# Patient Record
Sex: Female | Born: 1990 | Race: White | Hispanic: No | Marital: Single | Smoking: Never smoker
Health system: Southern US, Community
[De-identification: ages and names within clinical notes are randomized; demographics above are authoritative.]

## PROBLEM LIST (undated history)

## (undated) DIAGNOSIS — N200 Calculus of kidney: Secondary | ICD-10-CM

---

## 2014-07-21 ENCOUNTER — Emergency Department (HOSPITAL_BASED_OUTPATIENT_CLINIC_OR_DEPARTMENT_OTHER): Payer: Self-pay

## 2014-07-21 ENCOUNTER — Emergency Department (HOSPITAL_BASED_OUTPATIENT_CLINIC_OR_DEPARTMENT_OTHER)
Admission: EM | Admit: 2014-07-21 | Discharge: 2014-07-21 | Disposition: A | Discharge status: Home / Self Care | Payer: Self-pay | Attending: Emergency Medicine | Admitting: Emergency Medicine

## 2014-07-21 ENCOUNTER — Encounter (HOSPITAL_BASED_OUTPATIENT_CLINIC_OR_DEPARTMENT_OTHER): Payer: Self-pay | Admitting: *Deleted

## 2014-07-21 DIAGNOSIS — R319 Hematuria, unspecified: Secondary | ICD-10-CM | POA: Insufficient documentation

## 2014-07-21 DIAGNOSIS — Z3202 Encounter for pregnancy test, result negative: Secondary | ICD-10-CM | POA: Insufficient documentation

## 2014-07-21 DIAGNOSIS — R1031 Right lower quadrant pain: Secondary | ICD-10-CM

## 2014-07-21 DIAGNOSIS — N201 Calculus of ureter: Secondary | ICD-10-CM | POA: Insufficient documentation

## 2014-07-21 LAB — COMPREHENSIVE METABOLIC PANEL
ALT: 26 U/L (ref 0–35)
ANION GAP: 11 (ref 5–15)
AST: 57 U/L — ABNORMAL HIGH (ref 0–37)
Albumin: 4.7 g/dL (ref 3.5–5.2)
Alkaline Phosphatase: 66 U/L (ref 39–117)
BUN: 13 mg/dL (ref 6–23)
CO2: 22 mmol/L (ref 19–32)
Calcium: 9.5 mg/dL (ref 8.4–10.5)
Chloride: 105 mEq/L (ref 96–112)
Creatinine, Ser: 0.95 mg/dL (ref 0.50–1.10)
GFR calc Af Amer: >90 mL/min (ref >90)
GFR calc non Af Amer: 84 mL/min — ABNORMAL LOW (ref >90)
GLUCOSE: 108 mg/dL — AB (ref 70–99)
Potassium: 3.3 mmol/L — ABNORMAL LOW (ref 3.5–5.1)
SODIUM: 138 mmol/L (ref 135–145)
Total Bilirubin: 0.8 mg/dL (ref 0.3–1.2)
Total Protein: 8.1 g/dL (ref 6.0–8.3)

## 2014-07-21 LAB — CBC WITH DIFFERENTIAL/PLATELET
BASOS ABS: 0 10*3/uL (ref 0.0–0.1)
Basophils Relative: 0 % (ref 0–1)
EOS ABS: 0.1 10*3/uL (ref 0.0–0.7)
Eosinophils Relative: 1 % (ref 0–5)
HCT: 40.9 % (ref 36.0–46.0)
Hemoglobin: 14.3 g/dL (ref 12.0–15.0)
LYMPHS ABS: 3.1 10*3/uL (ref 0.7–4.0)
Lymphocytes Relative: 30 % (ref 12–46)
MCH: 30 pg (ref 26.0–34.0)
MCHC: 35 g/dL (ref 30.0–36.0)
MCV: 85.7 fL (ref 78.0–100.0)
Monocytes Absolute: 1.1 10*3/uL — ABNORMAL HIGH (ref 0.1–1.0)
Monocytes Relative: 10 % (ref 3–12)
Neutro Abs: 6 10*3/uL (ref 1.7–7.7)
Neutrophils Relative %: 59 % (ref 43–77)
PLATELETS: 336 10*3/uL (ref 150–400)
RBC: 4.77 MIL/uL (ref 3.87–5.11)
RDW: 12.1 % (ref 11.5–15.5)
WBC: 10.3 10*3/uL (ref 4.0–10.5)

## 2014-07-21 LAB — URINALYSIS, ROUTINE W REFLEX MICROSCOPIC
Bilirubin Urine: NEGATIVE
Glucose, UA: NEGATIVE mg/dL
Ketones, ur: 15 mg/dL — AB
LEUKOCYTES UA: NEGATIVE
NITRITE: NEGATIVE
Protein, ur: NEGATIVE mg/dL
SPECIFIC GRAVITY, URINE: 1.011 (ref 1.005–1.030)
Urobilinogen, UA: 0.2 mg/dL (ref 0.0–1.0)
pH: 8 (ref 5.0–8.0)

## 2014-07-21 LAB — PREGNANCY, URINE: Preg Test, Ur: NEGATIVE

## 2014-07-21 LAB — URINE MICROSCOPIC-ADD ON

## 2014-07-21 MED ORDER — ONDANSETRON HCL 4 MG/2ML IJ SOLN
4.0000 mg | Freq: Once | INTRAMUSCULAR | Status: AC
Start: 2014-07-21 — End: 2014-07-21
  Administered 2014-07-21: 4 mg via INTRAVENOUS
  Filled 2014-07-21: qty 2

## 2014-07-21 MED ORDER — IOHEXOL 300 MG/ML  SOLN
100.0000 mL | Freq: Once | INTRAMUSCULAR | Status: AC | PRN
  Administered 2014-07-21: 100 mL via INTRAVENOUS

## 2014-07-21 MED ORDER — MORPHINE SULFATE 4 MG/ML IJ SOLN
4.0000 mg | Freq: Once | INTRAMUSCULAR | Status: AC
  Administered 2014-07-21: 4 mg via INTRAVENOUS
  Filled 2014-07-21: qty 1

## 2014-07-21 MED ORDER — OXYCODONE-ACETAMINOPHEN 5-325 MG PO TABS: 1.0000 | ORAL_TABLET | ORAL | Status: DC | PRN

## 2014-07-21 MED ORDER — SODIUM CHLORIDE 0.9 % IV BOLUS (SEPSIS)
1000.0000 mL | Freq: Once | INTRAVENOUS | Status: AC
  Administered 2014-07-21: 1000 mL via INTRAVENOUS

## 2014-07-21 MED ORDER — IOHEXOL 300 MG/ML  SOLN
50.0000 mL | Freq: Once | INTRAMUSCULAR | Status: AC | PRN
  Administered 2014-07-21: 50 mL via ORAL

## 2014-07-21 MED ORDER — ONDANSETRON 4 MG PO TBDP: 4.0000 mg | ORAL_TABLET | Freq: Three times a day (TID) | ORAL | Status: DC | PRN

## 2014-07-21 MED ORDER — TAMSULOSIN HCL 0.4 MG PO CAPS: 0.4000 mg | ORAL_CAPSULE | Freq: Every day | ORAL | Status: DC

## 2014-07-21 NOTE — ED Notes (Addendum)
Patient having severe RLQ pain, started last night, worsening today.

## 2014-07-21 NOTE — Discharge Instructions (Signed)
Take the prescribed medication as directed.  Strain urine to monitor for passage of stone.  Drink plenty of water. Follow-up with urology if problems occur. Return to the ED for new or worsening symptoms.

## 2014-07-21 NOTE — ED Provider Notes (Signed)
CSN: [id number]     Arrival date & time 07/21/14  1149 History   First MD Initiated Contact with Patient 07/21/14 1157     Chief Complaint  Patient presents with  . Abdominal Pain     (Consider location/radiation/quality/duration/timing/severity/associated sxs/prior Treatment) Patient is a 23 y.o. female presenting with abdominal pain. The history is provided by the patient and medical records.  Abdominal Pain Associated symptoms: nausea and vomiting    This is a 23 y.o. F with no significant PMH presenting to the ED for abdominal pain.  Patient states pain began yesterday morning which she and her mother thought was due to constipation (she has been having issues with this lately).  States she attempted to drink a bottle of Mg+ citrate, however she vomited it up just a few minutes after drinking it.  States throughout the remainder of the day yesterday and into today she is had persistent nausea and vomiting. She has not been able to have a bowel movement. She reports pain in her right lower quadrant has been worsening, now is severe to the point where she cannot sit still. States when she attempts to urinate, the pain in her right lower abdomen increases, but she is not experiencing any dysuria. No abnormal vaginal discharge. No prior history of ovarian cysts. No fever or chills.  History reviewed. No pertinent past medical history. History reviewed. No pertinent past surgical history. No family history on file. History  Substance Use Topics  . Smoking status: Never Smoker   . Smokeless tobacco: Not on file  . Alcohol Use: No   OB History    No data available     Review of Systems  Gastrointestinal: Positive for nausea, vomiting and abdominal pain.  All other systems reviewed and are negative.     Allergies  Review of patient's allergies indicates not on file.  Home Medications   Prior to Admission medications   Not on File   BP 137/85 mmHg  Pulse 101  Temp(Src) 98 F  (36.7 C) (Oral)  Resp 20  Ht 5\' 4"  (1.626 m)  Wt 118 lb (53.524 kg)  BMI 20.24 kg/m2  SpO2 100%   Physical Exam  Constitutional: She is oriented to person, place, and time. She appears well-developed and well-nourished. No distress.  Appears uncomfortable, writhing in bed  HENT:  Head: Normocephalic and atraumatic.  Mouth/Throat: Oropharynx is clear and moist.  Mildly dry mucous membranes  Eyes: Conjunctivae and EOM are normal. Pupils are equal, round, and reactive to light.  Neck: Normal range of motion.  Cardiovascular: Normal rate, regular rhythm and normal heart sounds.   Pulmonary/Chest: Effort normal and breath sounds normal. No respiratory distress. She has no wheezes.  Abdominal: Soft. Bowel sounds are normal. There is tenderness in the right lower quadrant. There is guarding and tenderness at McBurney's point.  Abdomen soft, non-distended, focal tenderness in RLQ at McBurney's point with voluntary guarding  Musculoskeletal: Normal range of motion.  Neurological: She is alert and oriented to person, place, and time.  Skin: Skin is warm and dry. She is not diaphoretic.  Psychiatric: She has a normal mood and affect.  Nursing note and vitals reviewed.   ED Course  Procedures (including critical care time) Labs Review Labs Reviewed  CBC WITH DIFFERENTIAL - Abnormal; Notable for the following:    Monocytes Absolute 1.1 (*)    All other components within normal limits  COMPREHENSIVE METABOLIC PANEL - Abnormal; Notable for the following:    Potassium  3.3 (*)    Glucose, Bld 108 (*)    AST 57 (*)    GFR calc non Af Amer 84 (*)    All other components within normal limits  URINALYSIS, ROUTINE W REFLEX MICROSCOPIC - Abnormal; Notable for the following:    Hgb urine dipstick SMALL (*)    Ketones, ur 15 (*)    All other components within normal limits  PREGNANCY, URINE  URINE MICROSCOPIC-ADD ON    Imaging Review Ct Abdomen Pelvis W Contrast  07/21/2014   CLINICAL  DATA:  23 year old with right lower quadrant pain. Pain started yesterday and worsening today. Patient also has nausea, vomiting and constipation.  EXAM: CT ABDOMEN AND PELVIS WITH CONTRAST  TECHNIQUE: Multidetector CT imaging of the abdomen and pelvis was performed using the standard protocol following bolus administration of intravenous contrast.  CONTRAST:  50mL OMNIPAQUE IOHEXOL 300 MG/ML SOLN, 100mL OMNIPAQUE IOHEXOL 300 MG/ML SOLN  COMPARISON:  None.  FINDINGS: Lung bases are clear.  Negative for free intraperitoneal air.  Normal appearance of the liver, gallbladder and portal venous system. Normal appearance of the spleen and pancreas and adrenal glands. There is a 8 mm, hypodense structure along the left kidney upper pole. The Hounsfield units measure in the 50s, therefore, this is an indeterminate small renal lesion. Mild fullness of the left renal collecting system without frank hydronephrosis and no evidence for left perinephric edema. There is a 2 mm calcification in left kidney lower pole compatible with a small stone.  There is fullness of the right renal collecting system and right ureter. There is evidence for a 3 mm stone at the right ureterovesical junction. Mild distention of the urinary bladder with fluid. Mild enlargement of the left ovary and cannot exclude a small left ovarian cyst measuring up to 2.5 cm. No gross abnormality to the uterus or right adnexa. No significant free fluid. The proximal appendix contains contrast and there is no evidence for appendix inflammation. Appendix is in a retrocecal location. There is no gross abnormality to the stomach, small bowel or colon.  No acute bone abnormality.  IMPRESSION: Mild right hydroureteronephrosis due to a 3 mm stone at the right ureterovesical junction. Mild fullness in the left renal collecting system without a left ureter stone. There is a small stone in left kidney lower pole.  There is an indeterminate 8 mm hypodense structure in the  left kidney upper pole. This probably represents a small complex cyst but indeterminate. Consider a follow-up non emergent ultrasound to see if this can be definitively characterized as a cyst.  Normal appearance of the appendix.  Possible left ovarian cyst.   Electronically Signed   By: Richarda OverlieAdam  Henn M.D.   On: 07/21/2014 14:39     EKG Interpretation None      MDM   Final diagnoses:  RLQ abdominal pain  Right ureteral stone  Hematuria   23 year old female with aggressively worsening right lower quadrant pain associated with nausea and vomiting. On exam, patient is afebrile, but she appears greatly uncomfortable and is writhing in bed. Abdominal exam with positive McBurney's point tenderness with voluntary guarding. She has been having issues with constipation, however clinical concern for appendicitis remains high. Will obtain lab work, UA, and CT scan for further evaluation.  Lab work overall reassuring, slight hypokalemia likely from vomiting. UA noninfectious, small blood noted. CT scan revealing a 3mm right ureteral stone with secondary hydroureteronephrosis, normal appendix.  Patient symptoms are currently well controlled after a single dose  of morphine and Zofran. Patient given urine strainer. She'll be discharged home with Percocet, Zofran, and Flomax. She was instructed to strain her urine to monitor for passage of stone. Patient given urology follow-up.  Discussed plan with patient, he/she acknowledged understanding and agreed with plan of care.  Return precautions given for new or worsening symptoms.  Garlon Hatchet, PA-C 07/21/14 1616  Gilda Crease, MD 07/23/14 [phone]

## 2016-07-16 ENCOUNTER — Emergency Department (HOSPITAL_BASED_OUTPATIENT_CLINIC_OR_DEPARTMENT_OTHER)
Admission: EM | Admit: 2016-07-16 | Discharge: 2016-07-16 | Disposition: A | Discharge status: Home / Self Care | Payer: Managed Care, Other (non HMO) | Attending: Emergency Medicine | Admitting: Emergency Medicine

## 2016-07-16 ENCOUNTER — Emergency Department (HOSPITAL_BASED_OUTPATIENT_CLINIC_OR_DEPARTMENT_OTHER): Payer: Managed Care, Other (non HMO)

## 2016-07-16 ENCOUNTER — Encounter (HOSPITAL_BASED_OUTPATIENT_CLINIC_OR_DEPARTMENT_OTHER): Payer: Self-pay

## 2016-07-16 DIAGNOSIS — F1290 Cannabis use, unspecified, uncomplicated: Secondary | ICD-10-CM | POA: Diagnosis not present

## 2016-07-16 DIAGNOSIS — R1032 Left lower quadrant pain: Secondary | ICD-10-CM | POA: Diagnosis present

## 2016-07-16 DIAGNOSIS — N201 Calculus of ureter: Secondary | ICD-10-CM | POA: Diagnosis not present

## 2016-07-16 HISTORY — DX: Calculus of kidney: N20.0

## 2016-07-16 LAB — BASIC METABOLIC PANEL
ANION GAP: 10 (ref 5–15)
BUN: 13 mg/dL (ref 6–20)
CALCIUM: 9 mg/dL (ref 8.9–10.3)
CO2: 22 mmol/L (ref 22–32)
Chloride: 106 mmol/L (ref 101–111)
Creatinine, Ser: 0.86 mg/dL (ref 0.44–1.00)
GFR calc Af Amer: >60 mL/min (ref >60)
GLUCOSE: 85 mg/dL (ref 65–99)
Potassium: 3.7 mmol/L (ref 3.5–5.1)
Sodium: 138 mmol/L (ref 135–145)

## 2016-07-16 LAB — URINALYSIS, ROUTINE W REFLEX MICROSCOPIC
Bilirubin Urine: NEGATIVE
GLUCOSE, UA: NEGATIVE mg/dL
Ketones, ur: NEGATIVE mg/dL
LEUKOCYTES UA: NEGATIVE
Nitrite: NEGATIVE
Protein, ur: NEGATIVE mg/dL
Specific Gravity, Urine: 1.021 (ref 1.005–1.030)
pH: 6 (ref 5.0–8.0)

## 2016-07-16 LAB — CBC WITH DIFFERENTIAL/PLATELET
BASOS ABS: 0.1 10*3/uL (ref 0.0–0.1)
BASOS PCT: 1 %
EOS ABS: 0.2 10*3/uL (ref 0.0–0.7)
Eosinophils Relative: 2 %
HCT: 37.4 % (ref 36.0–46.0)
Hemoglobin: 12.7 g/dL (ref 12.0–15.0)
Lymphocytes Relative: 18 %
Lymphs Abs: 1.6 10*3/uL (ref 0.7–4.0)
MCH: 29 pg (ref 26.0–34.0)
MCHC: 34 g/dL (ref 30.0–36.0)
MCV: 85.4 fL (ref 78.0–100.0)
MONO ABS: 1 10*3/uL (ref 0.1–1.0)
Monocytes Relative: 11 %
NEUTROS ABS: 6.3 10*3/uL (ref 1.7–7.7)
Neutrophils Relative %: 68 %
PLATELETS: 304 10*3/uL (ref 150–400)
RBC: 4.38 MIL/uL (ref 3.87–5.11)
RDW: 12 % (ref 11.5–15.5)
WBC: 9.1 10*3/uL (ref 4.0–10.5)

## 2016-07-16 LAB — URINALYSIS, MICROSCOPIC (REFLEX)

## 2016-07-16 LAB — PREGNANCY, URINE: Preg Test, Ur: NEGATIVE

## 2016-07-16 MED ORDER — ONDANSETRON HCL 4 MG/2ML IJ SOLN
4.0000 mg | Freq: Once | INTRAMUSCULAR | Status: AC
Start: 2016-07-16 — End: 2016-07-16
  Administered 2016-07-16: 4 mg via INTRAVENOUS
  Filled 2016-07-16: qty 2

## 2016-07-16 MED ORDER — TAMSULOSIN HCL 0.4 MG PO CAPS: 0.4000 mg | ORAL_CAPSULE | Freq: Every day | ORAL | 0 refills | Status: AC

## 2016-07-16 MED ORDER — OXYCODONE-ACETAMINOPHEN 5-325 MG PO TABS: 1.0000 | ORAL_TABLET | ORAL | 0 refills | Status: AC | PRN

## 2016-07-16 MED ORDER — ONDANSETRON HCL 4 MG PO TABS: 4.0000 mg | ORAL_TABLET | Freq: Three times a day (TID) | ORAL | 0 refills | Status: AC | PRN

## 2016-07-16 MED ORDER — ONDANSETRON HCL 4 MG/2ML IJ SOLN
4.0000 mg | Freq: Once | INTRAMUSCULAR | Status: AC
  Administered 2016-07-16: 4 mg via INTRAVENOUS
  Filled 2016-07-16: qty 2

## 2016-07-16 MED ORDER — MORPHINE SULFATE (PF) 4 MG/ML IV SOLN
6.0000 mg | Freq: Once | INTRAVENOUS | Status: AC
  Administered 2016-07-16: 6 mg via INTRAVENOUS
  Filled 2016-07-16: qty 2

## 2016-07-16 NOTE — ED Provider Notes (Signed)
[facility]   CSN: [id number] Arrival date & time: 07/16/16  1525     History   Chief Complaint Chief Complaint  Patient presents with  . Flank Pain    HPI Michele Terry is a 25 y.o. female.  HPI  [id number] year old female presents with LLQ abdominal and left lower back pain. Started a few days ago but not that bad. Severely worse 2 hours ago. Coming and going before then. Nausea since the pain got worse. No fevers or diarrhea. No dysuria or hematuria, but some stinging during the urine sample here. Feels like when she had a kidney stone 2 years ago. Periods are irregular. Feels like she can't get comfortable or find a comfortable spot.  Past Medical History:  Diagnosis Date  . Kidney stone     There are no active problems to display for this patient.   History reviewed. No pertinent surgical history.  OB History    No data available       Home Medications    Prior to Admission medications   Medication Sig Start Date End Date Taking? Authorizing Provider  ondansetron (ZOFRAN) 4 MG tablet Take 1 tablet (4 mg total) by mouth every 8 (eight) hours as needed for nausea or vomiting. 07/16/16   Pricilla LovelessScott Chioma Mukherjee, MD  oxyCODONE-acetaminophen (PERCOCET) 5-325 MG tablet Take 1 tablet by mouth every 4 (four) hours as needed for severe pain. 07/16/16   Pricilla LovelessScott Anni Hocevar, MD  tamsulosin (FLOMAX) 0.4 MG CAPS capsule Take 1 capsule (0.4 mg total) by mouth daily. 07/16/16   Pricilla LovelessScott Kadan Millstein, MD    Family History No family history on file.  Social History Social History  Substance Use Topics  . Smoking status: Never Smoker  . Smokeless tobacco: Never Used  . Alcohol use Yes     Comment: occ     Allergies   Patient has no known allergies.   Review of Systems Review of Systems  Constitutional: Negative for fever.  Gastrointestinal: Positive for abdominal pain and nausea. Negative for vomiting.  Genitourinary: Negative for dysuria and hematuria.    Musculoskeletal: Positive for back pain.  All other systems reviewed and are negative.    Physical Exam Updated Vital Signs BP 120/89 (BP Location: Left Arm)   Pulse 74   Temp 97.9 F (36.6 C) (Oral)   Resp 18   Wt 118 lb (53.5 kg)   LMP  (LMP Unknown)   SpO2 100%   BMI 20.25 kg/m   Physical Exam  Constitutional: She is oriented to person, place, and time. She appears well-developed and well-nourished.  Laying on left side, appears in pain  HENT:  Head: Normocephalic and atraumatic.  Right Ear: External ear normal.  Left Ear: External ear normal.  Nose: Nose normal.  Eyes: Right eye exhibits no discharge. Left eye exhibits no discharge.  Cardiovascular: Normal rate, regular rhythm and normal heart sounds.   Pulmonary/Chest: Effort normal and breath sounds normal.  Abdominal: Soft. She exhibits no distension. There is no tenderness.  Neurological: She is alert and oriented to person, place, and time.  Skin: Skin is warm and dry. She is not diaphoretic.  Nursing note and vitals reviewed.    ED Treatments / Results  Labs (all labs ordered are listed, but only abnormal results are displayed) Labs Reviewed  URINALYSIS, ROUTINE W REFLEX MICROSCOPIC - Abnormal; Notable for the following:       Result Value   APPearance CLOUDY (*)    Hgb urine  dipstick LARGE (*)    All other components within normal limits  URINALYSIS, MICROSCOPIC (REFLEX) - Abnormal; Notable for the following:    Bacteria, UA MANY (*)    Squamous Epithelial / LPF 6-30 (*)    All other components within normal limits  PREGNANCY, URINE  BASIC METABOLIC PANEL  CBC WITH DIFFERENTIAL/PLATELET    EKG  EKG Interpretation None       Radiology Ct Renal Stone Study  Result Date: 07/16/2016 CLINICAL DATA:  Left flank and lower quadrant abdominal pain for several days EXAM: CT ABDOMEN AND PELVIS WITHOUT CONTRAST TECHNIQUE: Multidetector CT imaging of the abdomen and pelvis was performed following the  standard protocol without IV contrast. COMPARISON:  None. FINDINGS: Lower chest: Lung bases demonstrate no acute consolidation or pleural effusion. Normal heart size. Hepatobiliary: No focal liver abnormality is seen. No gallstones, gallbladder wall thickening, or biliary dilatation. Pancreas: Unremarkable. No pancreatic ductal dilatation or surrounding inflammatory changes. Spleen: Normal in size without focal abnormality. Adrenals/Urinary Tract: Adrenal glands are within normal limits. Multiple punctate nonobstructing stones are present within the right kidney. Multiple punctate stones are also present in the left kidney. Mild to moderate left hydronephrosis and hydroureter secondary to a 3 mm stone within the distal left ureter just above the left UVJ. Bladder normal. Stomach/Bowel: Stomach is within normal limits. Appendix appears normal. No evidence of bowel wall thickening, distention, or inflammatory changes. Vascular/Lymphatic: No significant vascular findings are present. No enlarged abdominal or pelvic lymph nodes. Reproductive: Uterus and bilateral adnexa are unremarkable. Other: No abdominal wall hernia or abnormality. Small amount of free fluid in the pelvis. Musculoskeletal: No acute or significant osseous findings. IMPRESSION: 1. Mild to moderate left hydronephrosis and hydroureter secondary to a 3 mm stone within the distal left ureter, just above the left UVJ. 2. Multiple punctate intrarenal calculi bilaterally. 3. Small amount of free fluid in the pelvis Electronically Signed   By: Jasmine PangKim  Fujinaga M.D.   On: 07/16/2016 17:35    Procedures Procedures (including critical care time)  Medications Ordered in ED Medications  ondansetron (ZOFRAN) injection 4 mg (4 mg Intravenous Given 07/16/16 1627)  morphine 4 MG/ML injection 6 mg (6 mg Intravenous Given 07/16/16 1705)  ondansetron (ZOFRAN) injection 4 mg (4 mg Intravenous Given 07/16/16 1817)     Initial Impression / Assessment and Plan / ED  Course  I have reviewed the triage vital signs and the nursing notes.  Pertinent labs & imaging results that were available during my care of the patient were reviewed by me and considered in my medical decision making (see chart for details).  Clinical Course as of Jul 17 100  Fri Jul 16, 2016  1658 This is most likely a ureteral stone, will get CT to confirm. IV morphine, zofran, CBC/BMP  [SG]  1806 Patient feels much better. CT and workup is consistent with ureteral stone. She has bacteria and her urine but no white blood cells. I highly doubt this is an infected stone. No fever, normal WBC. Discharge home with pain medicine and urology follow-up. Discussed strict return precautions.  [SG]    Clinical Course User Index [SG] Pricilla LovelessScott Tomicka Lover, MD    Final Clinical Impressions(s) / ED Diagnoses   Final diagnoses:  Left ureteral stone    New Prescriptions Discharge Medication List as of 07/16/2016  6:09 PM    START taking these medications   Details  ondansetron (ZOFRAN) 4 MG tablet Take 1 tablet (4 mg total) by mouth every 8 (eight)  hours as needed for nausea or vomiting., Starting Fri 07/16/2016, Print    oxyCODONE-acetaminophen (PERCOCET) 5-325 MG tablet Take 1 tablet by mouth every 4 (four) hours as needed for severe pain., Starting Fri 07/16/2016, Print    tamsulosin (FLOMAX) 0.4 MG CAPS capsule Take 1 capsule (0.4 mg total) by mouth daily., Starting Fri 07/16/2016, Print         Pricilla Loveless, MD 07/17/16 [phone]

## 2016-07-16 NOTE — ED Triage Notes (Signed)
C/o left flank x today-states feels like kidney stone pain-NAD-steady gait

## 2016-07-16 NOTE — ED Notes (Signed)
Patient transported to CT 

## 2018-07-11 IMAGING — CT CT RENAL STONE PROTOCOL
2 of 4 series · 16 of 46 positions shown, 18 images · non-contrast
Comparison: None.

CLINICAL DATA: Left flank and lower quadrant abdominal pain for
several days

EXAM:
CT ABDOMEN AND PELVIS WITHOUT CONTRAST
TECHNIQUE: Multidetector CT imaging of the abdomen and pelvis was performed
following the standard protocol without IV contrast.

[Series 2: axial st · axial · 0.80mm/px · z∈[-390,-25]mm · 13 of 81 slices shown, 15 images]
[im 4/81  soft-tissue]
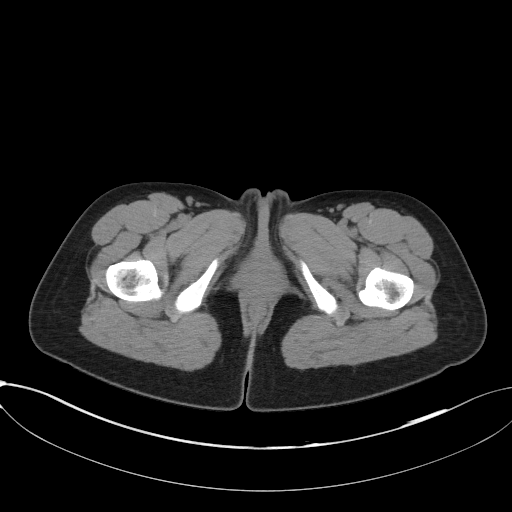
[im 4/81  bone]
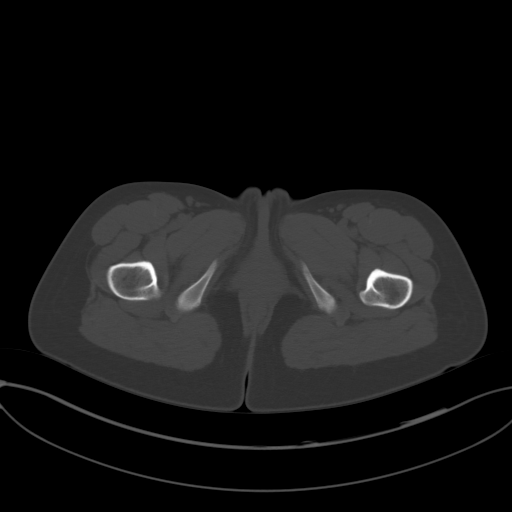
[im 10/81  soft-tissue]
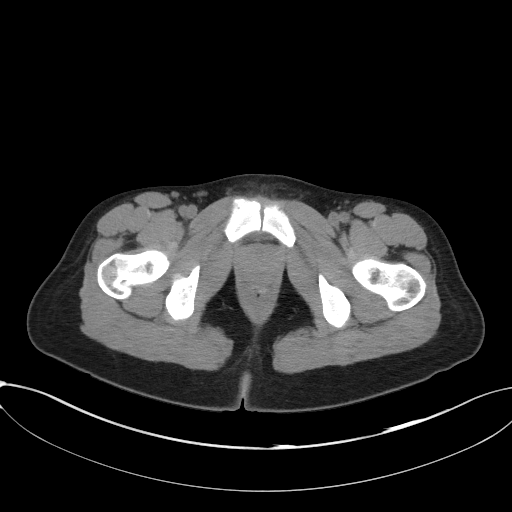
[im 16/81  soft-tissue]
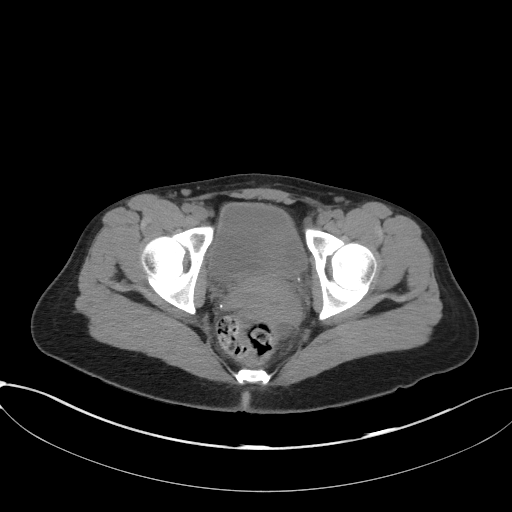
[im 22/81  soft-tissue]
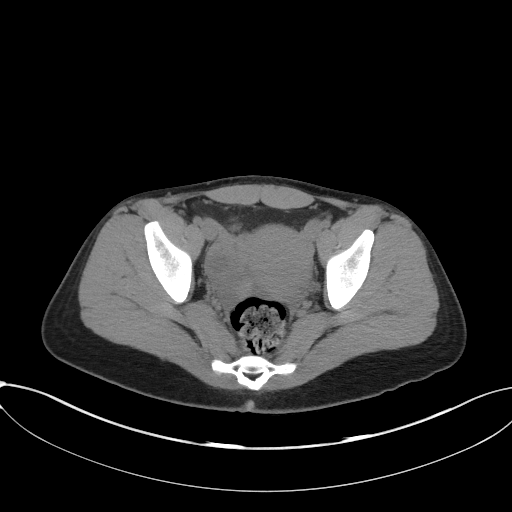
[im 28/81  soft-tissue]
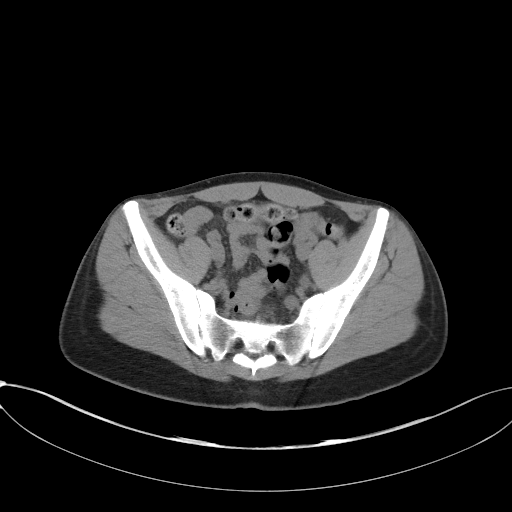
[im 34/81  soft-tissue]
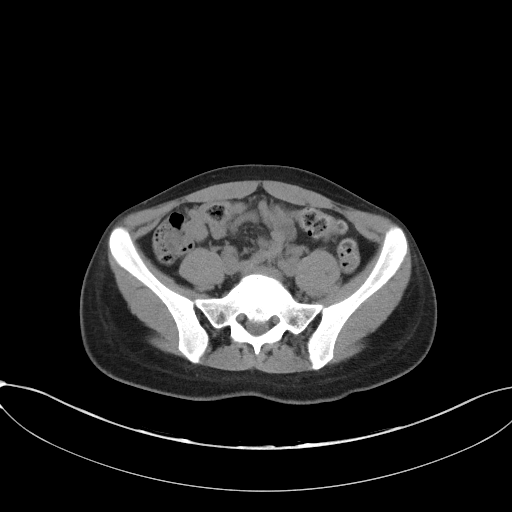
[im 41/81  soft-tissue]
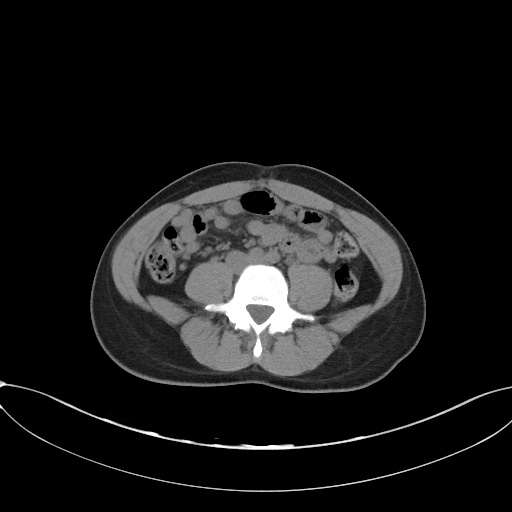
[im 47/81  soft-tissue]
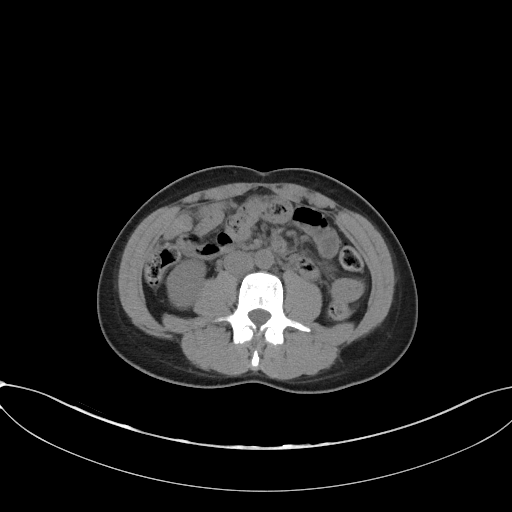
[im 53/81  soft-tissue]
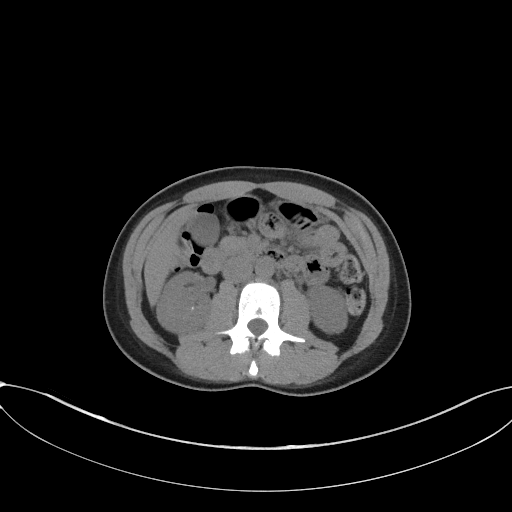
[im 53/81  bone]
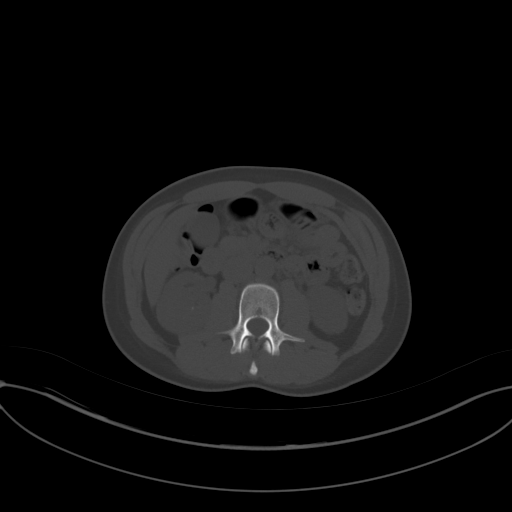
[im 59/81  soft-tissue]
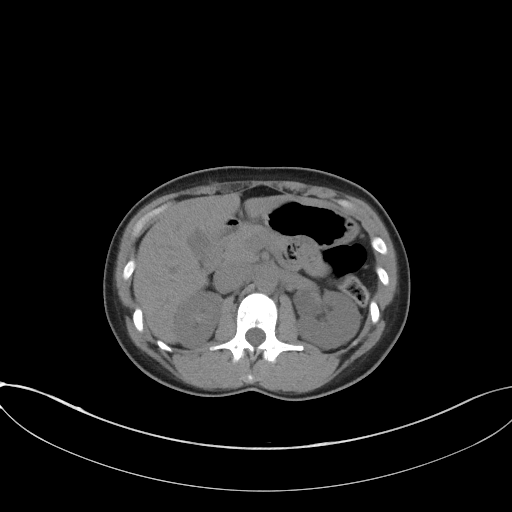
[im 65/81  soft-tissue]
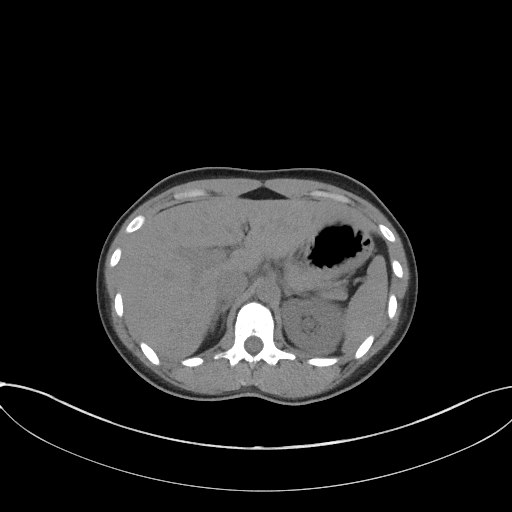
[im 71/81  soft-tissue]
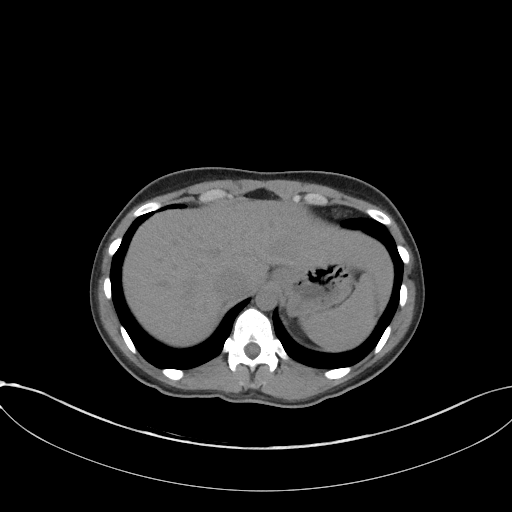
[im 77/81  soft-tissue]
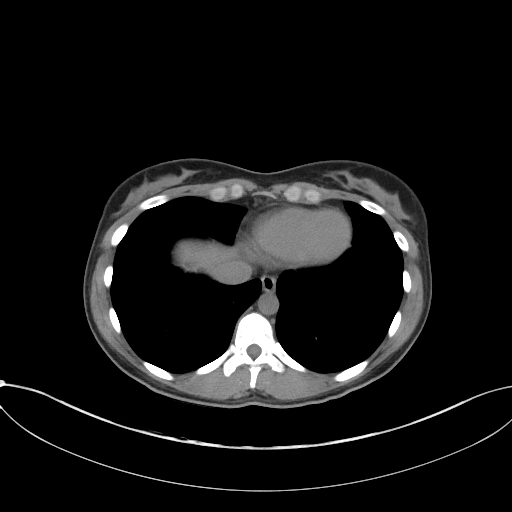

[Series 5: coronal st · coronal · 0.81mm/px · 3 of 65 slices shown]
[im 22/65  soft-tissue]
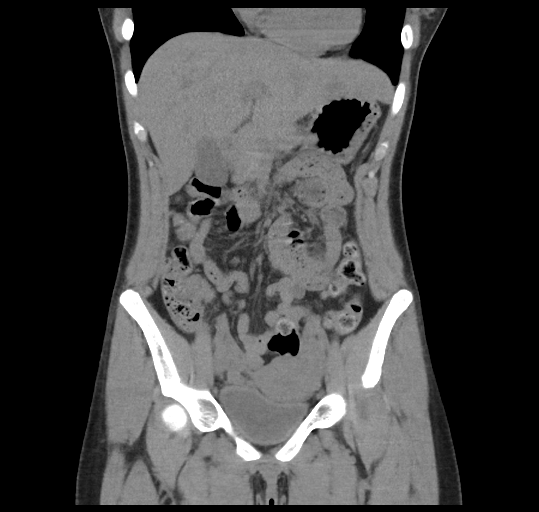
[im 29/65  soft-tissue]
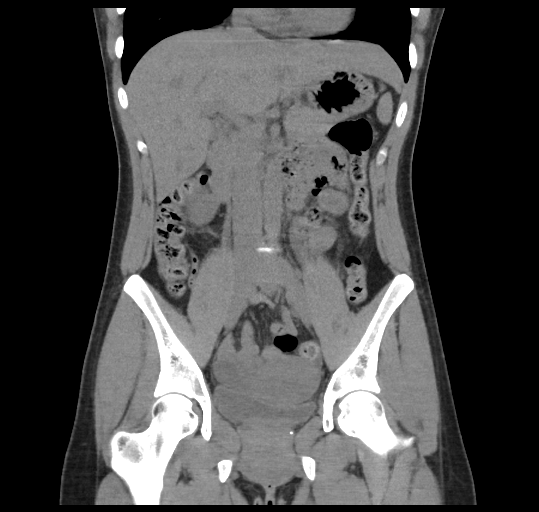
[im 36/65  soft-tissue]
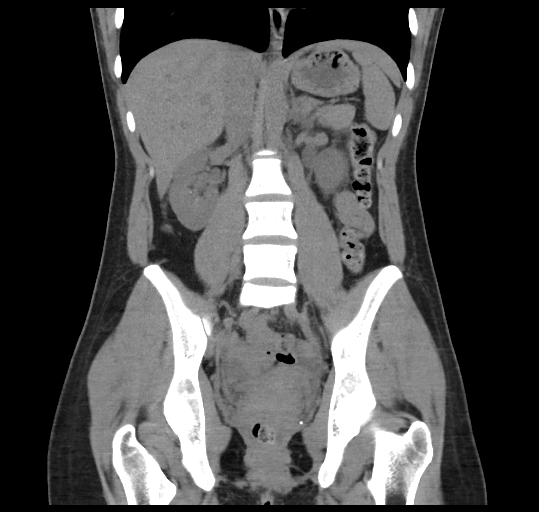

[16 of 46 positions shown; findings below may reference images not displayed]

FINDINGS: Lower chest: Lung bases demonstrate no acute consolidation or
pleural effusion. Normal heart size.

Hepatobiliary: No focal liver abnormality is seen. No gallstones,
gallbladder wall thickening, or biliary dilatation.

Pancreas: Unremarkable. No pancreatic ductal dilatation or
surrounding inflammatory changes.

Spleen: Normal in size without focal abnormality.

Adrenals/Urinary Tract: Adrenal glands are within normal limits.
Multiple punctate nonobstructing stones are present within the right
kidney. Multiple punctate stones are also present in the left
kidney. Mild to moderate left hydronephrosis and hydroureter
secondary to a 3 mm stone within the distal left ureter just above
the left UVJ. Bladder normal.

Stomach/Bowel: Stomach is within normal limits. Appendix appears
normal. No evidence of bowel wall thickening, distention, or
inflammatory changes.

Vascular/Lymphatic: No significant vascular findings are present. No
enlarged abdominal or pelvic lymph nodes.

Reproductive: Uterus and bilateral adnexa are unremarkable.

Other: No abdominal wall hernia or abnormality. Small amount of free
fluid in the pelvis.

Musculoskeletal: No acute or significant osseous findings.
IMPRESSION: 1. Mild to moderate left hydronephrosis and hydroureter secondary to
a 3 mm stone within the distal left ureter, just above the left UVJ.
2. Multiple punctate intrarenal calculi bilaterally.
3. Small amount of free fluid in the pelvis

## 2023-01-31 ENCOUNTER — Ambulatory Visit: Payer: Managed Care, Other (non HMO) | Admitting: Family Medicine

## 2023-02-10 ENCOUNTER — Ambulatory Visit: Payer: Medicaid Other | Admitting: Obstetrics and Gynecology
